# Patient Record
Sex: Male | Born: 1945 | Race: Black or African American | Hispanic: No | Marital: Single
Health system: Southern US, Community
[De-identification: ages and names within clinical notes are randomized; demographics above are authoritative.]

## PROBLEM LIST (undated history)

## (undated) ENCOUNTER — Emergency Department (HOSPITAL_COMMUNITY): Payer: Medicare HMO

## (undated) DIAGNOSIS — I10 Essential (primary) hypertension: Secondary | ICD-10-CM

## (undated) DIAGNOSIS — K635 Polyp of colon: Secondary | ICD-10-CM

## (undated) DIAGNOSIS — E119 Type 2 diabetes mellitus without complications: Secondary | ICD-10-CM

## (undated) DIAGNOSIS — E7800 Pure hypercholesterolemia, unspecified: Secondary | ICD-10-CM

## (undated) HISTORY — PX: COLON SURGERY: SHX602

---

## 2001-12-22 ENCOUNTER — Encounter: Payer: Self-pay | Admitting: Family Medicine

## 2001-12-22 ENCOUNTER — Encounter: Admission: RE | Admit: 2001-12-22 | Discharge: 2001-12-22 | Payer: Self-pay | Admitting: Family Medicine

## 2003-08-31 ENCOUNTER — Ambulatory Visit (HOSPITAL_COMMUNITY): Admission: RE | Admit: 2003-08-31 | Discharge: 2003-08-31 | Payer: Self-pay | Admitting: Urology

## 2003-09-28 ENCOUNTER — Ambulatory Visit: Admission: RE | Admit: 2003-09-28 | Discharge: 2003-12-27 | Payer: Self-pay | Admitting: Radiation Oncology

## 2003-11-24 ENCOUNTER — Encounter: Admission: RE | Admit: 2003-11-24 | Discharge: 2003-11-24 | Payer: Self-pay | Admitting: Urology

## 2003-12-01 ENCOUNTER — Ambulatory Visit (HOSPITAL_BASED_OUTPATIENT_CLINIC_OR_DEPARTMENT_OTHER): Admission: RE | Admit: 2003-12-01 | Discharge: 2003-12-01 | Payer: Self-pay | Admitting: Urology

## 2003-12-01 ENCOUNTER — Ambulatory Visit (HOSPITAL_COMMUNITY): Admission: RE | Admit: 2003-12-01 | Discharge: 2003-12-01 | Payer: Self-pay | Admitting: Urology

## 2003-12-31 ENCOUNTER — Ambulatory Visit: Admission: RE | Admit: 2003-12-31 | Discharge: 2004-01-19 | Payer: Self-pay | Admitting: Radiation Oncology

## 2005-07-21 ENCOUNTER — Inpatient Hospital Stay (HOSPITAL_COMMUNITY): Admission: EM | Admit: 2005-07-21 | Discharge: 2005-07-25 | Payer: Self-pay | Admitting: Emergency Medicine

## 2005-12-02 IMAGING — CR DG CHEST 2V
2 series · 2 of 2 positions shown · non-contrast
Comparison: none

CLINICAL DATA: Preop respiratory exam for prostate carcinoma treatment.
 TWO VIEW CHEST ? 11/24/03:

[view not recorded (1 of 2)]
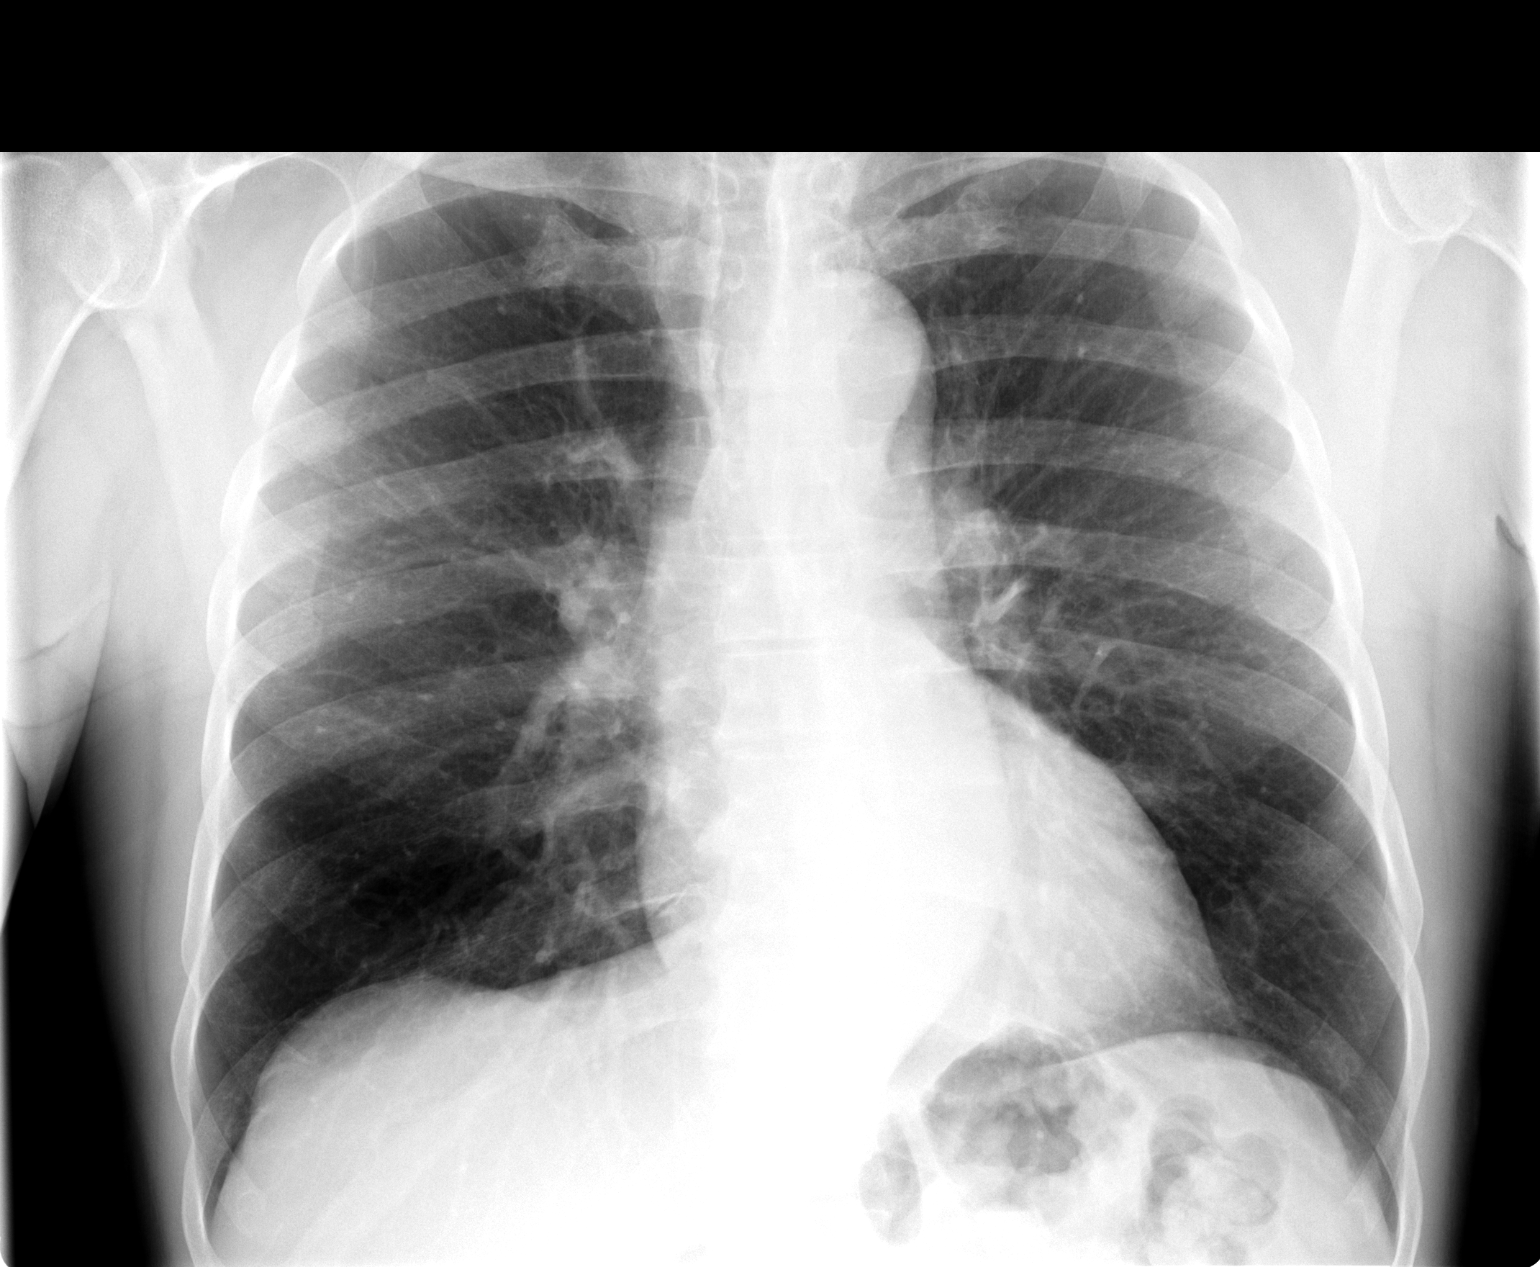

[view not recorded (2 of 2)]
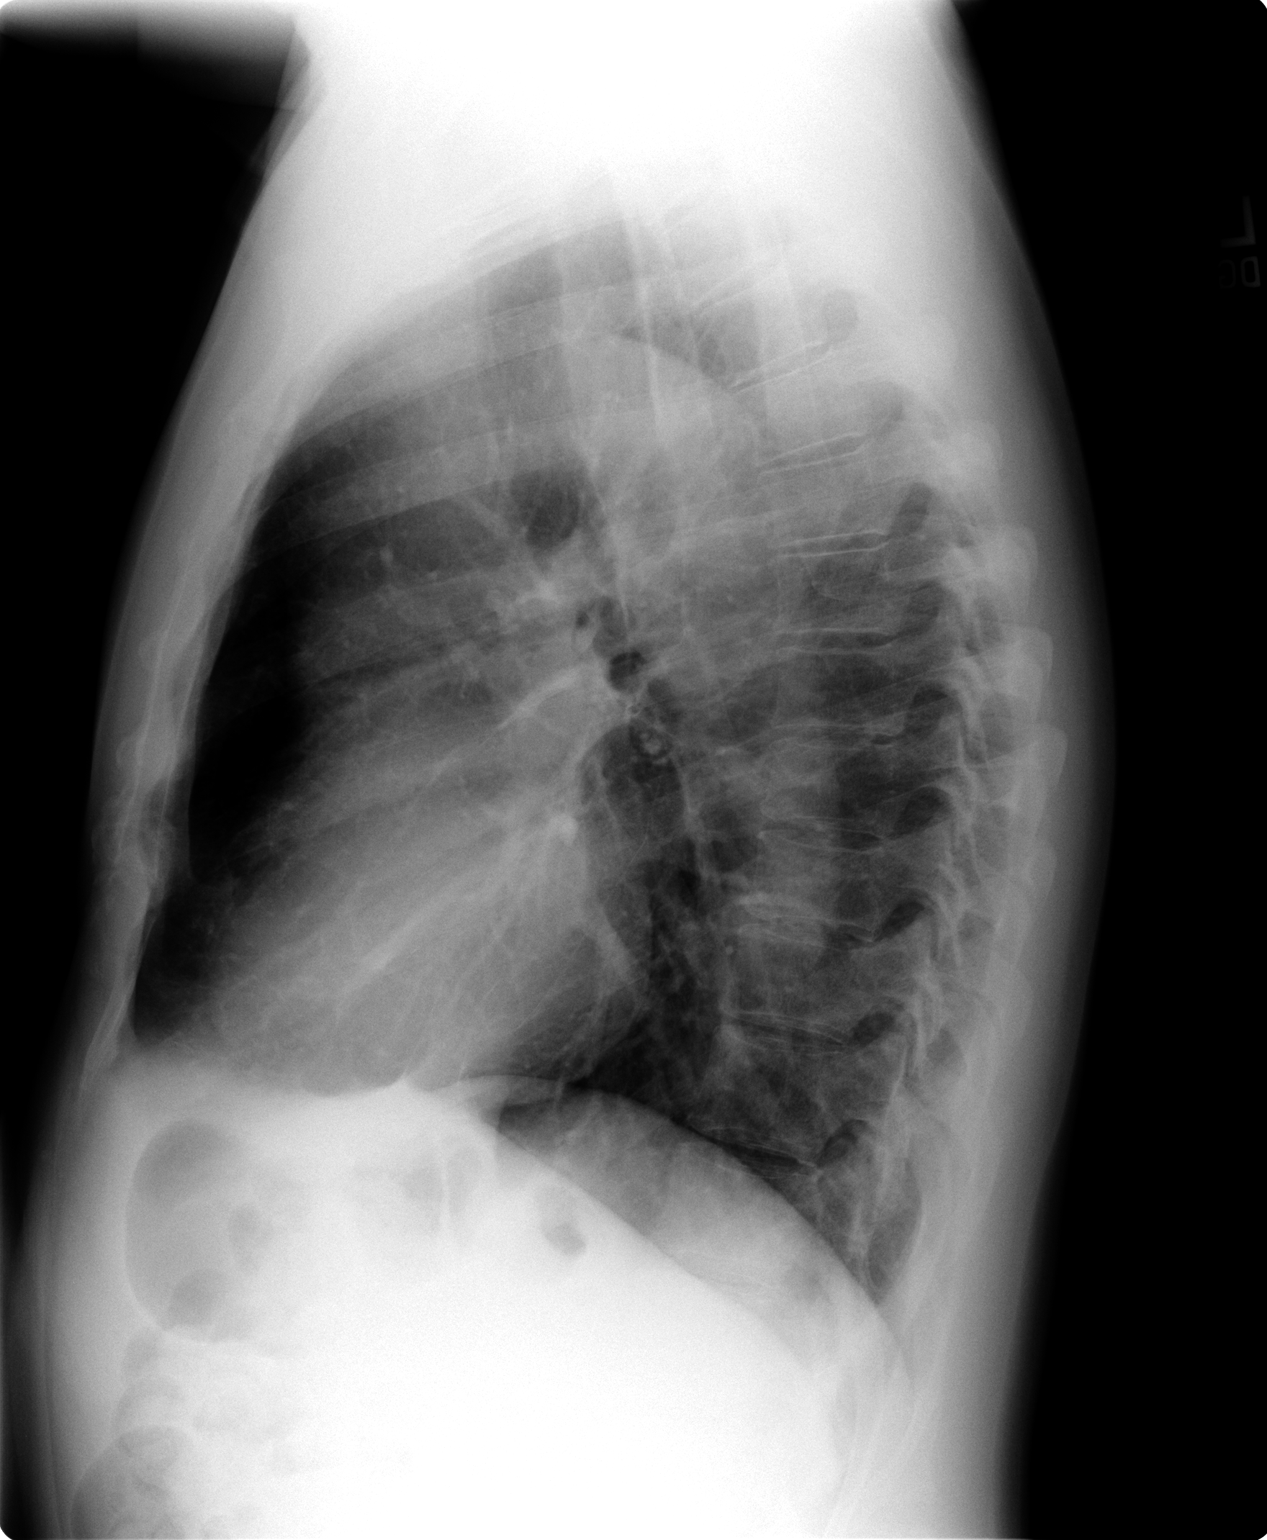

[2 of 2 positions shown; findings below may reference images not displayed]

FINDINGS: Mild cardiomegaly and tortuosity/ectasia of the thoracic aorta noted.  The lungs are clear.  Visualized bony thorax and upper abdomen are unremarkable.
IMPRESSION: 1.  No evidence of acute cardiopulmonary disease.
 2.  Cardiomegaly and ectatic/tortuous thoracic aorta.

## 2007-07-31 IMAGING — CR DG CHEST 1V PORT
1 series · 1 of 1 positions shown · non-contrast
Comparison: 11/24/03.

CLINICAL DATA: 60-year-old with lower GI bleeding. 
 PORTABLE CHEST ? 1 VIEW:

[view not recorded]
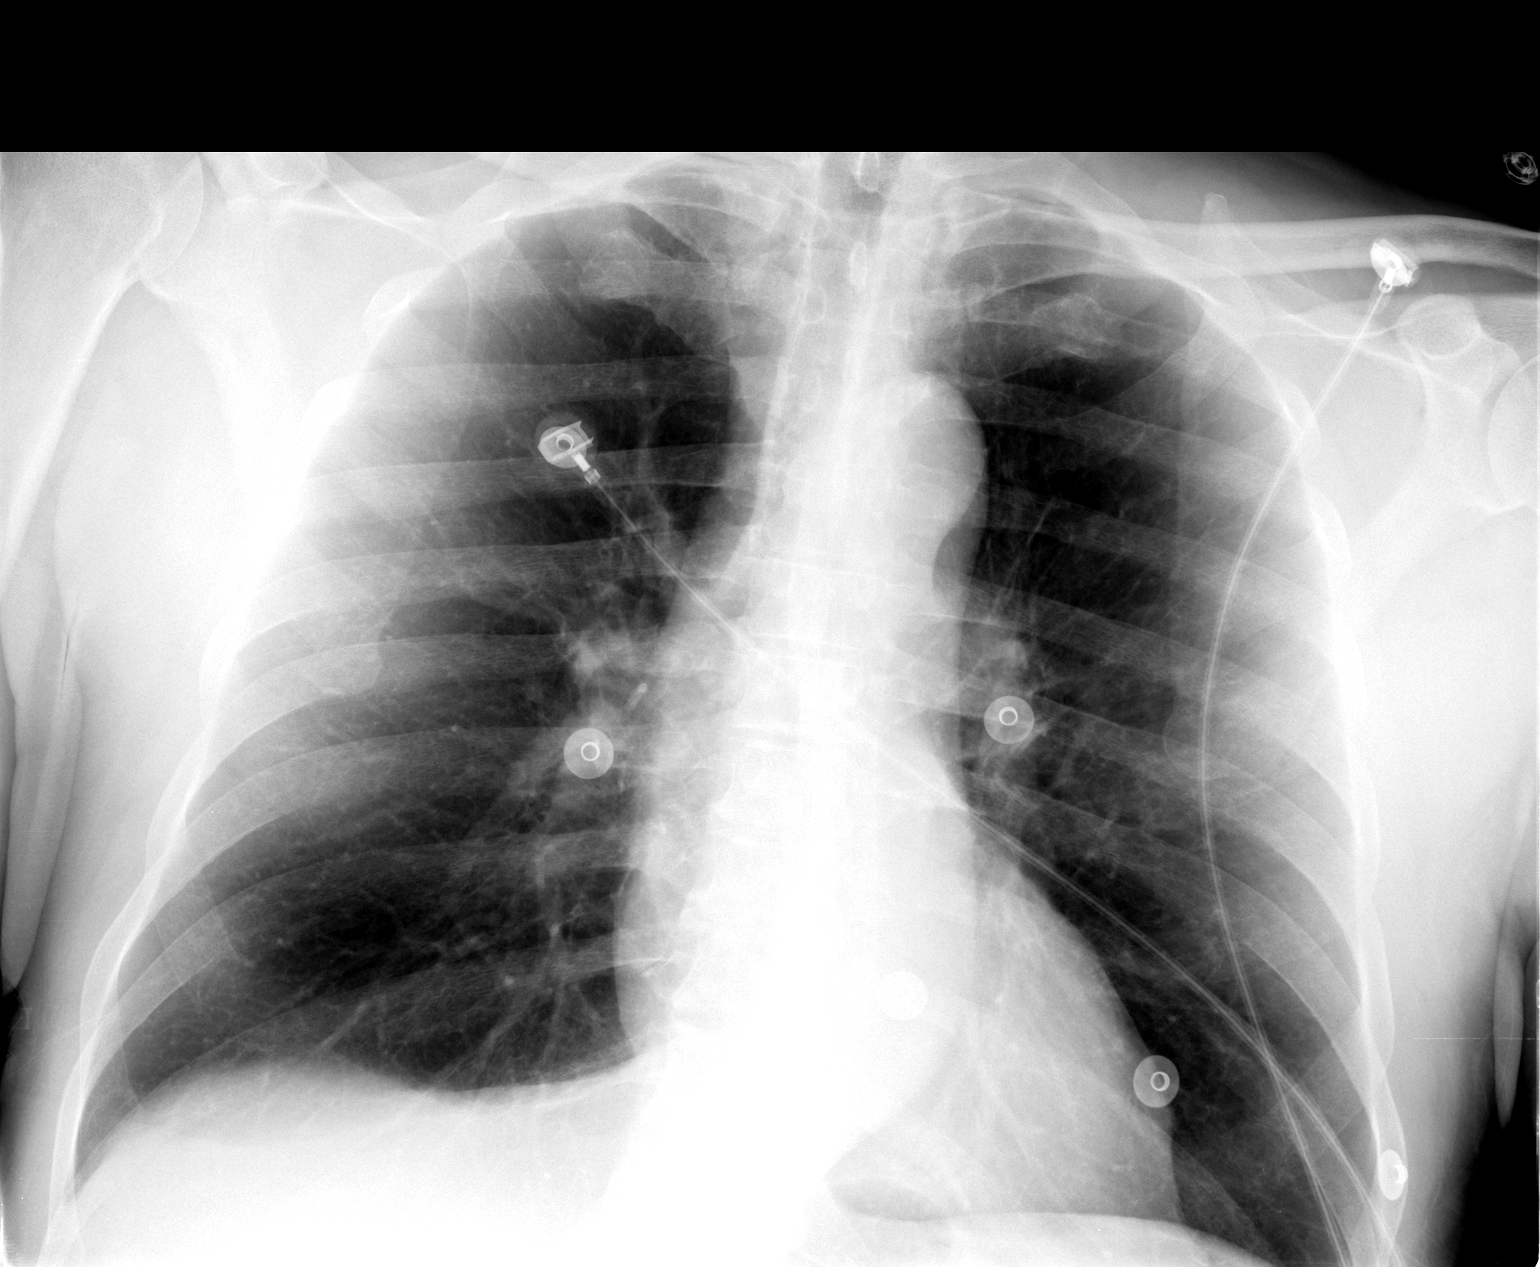

[1 of 1 positions shown; findings below may reference images not displayed]

FINDINGS: Lungs are mildly hyperinflated.  The heart is normal in size.  Lungs are free of focal consolidations and pleural effusions.
IMPRESSION: 1.  Hyperinflation.  
 2.  No evidence for acute cardiopulmonary disease.

## 2011-03-22 DIAGNOSIS — I10 Essential (primary) hypertension: Secondary | ICD-10-CM | POA: Diagnosis not present

## 2011-03-22 DIAGNOSIS — Z79899 Other long term (current) drug therapy: Secondary | ICD-10-CM | POA: Diagnosis not present

## 2011-03-22 DIAGNOSIS — R7309 Other abnormal glucose: Secondary | ICD-10-CM | POA: Diagnosis not present

## 2011-03-22 DIAGNOSIS — E7800 Pure hypercholesterolemia, unspecified: Secondary | ICD-10-CM | POA: Diagnosis not present

## 2011-03-22 DIAGNOSIS — J309 Allergic rhinitis, unspecified: Secondary | ICD-10-CM | POA: Diagnosis not present

## 2011-04-18 DIAGNOSIS — J019 Acute sinusitis, unspecified: Secondary | ICD-10-CM | POA: Diagnosis not present

## 2011-04-18 DIAGNOSIS — J40 Bronchitis, not specified as acute or chronic: Secondary | ICD-10-CM | POA: Diagnosis not present

## 2011-07-27 DIAGNOSIS — H2589 Other age-related cataract: Secondary | ICD-10-CM | POA: Diagnosis not present

## 2011-08-13 DIAGNOSIS — H2589 Other age-related cataract: Secondary | ICD-10-CM | POA: Diagnosis not present

## 2011-08-13 DIAGNOSIS — H2510 Age-related nuclear cataract, unspecified eye: Secondary | ICD-10-CM | POA: Diagnosis not present

## 2011-08-13 DIAGNOSIS — H40009 Preglaucoma, unspecified, unspecified eye: Secondary | ICD-10-CM | POA: Diagnosis not present

## 2011-08-20 DIAGNOSIS — H2510 Age-related nuclear cataract, unspecified eye: Secondary | ICD-10-CM | POA: Diagnosis not present

## 2011-08-20 DIAGNOSIS — H2589 Other age-related cataract: Secondary | ICD-10-CM | POA: Diagnosis not present

## 2011-09-20 DIAGNOSIS — E7800 Pure hypercholesterolemia, unspecified: Secondary | ICD-10-CM | POA: Diagnosis not present

## 2011-09-20 DIAGNOSIS — R7309 Other abnormal glucose: Secondary | ICD-10-CM | POA: Diagnosis not present

## 2011-09-20 DIAGNOSIS — I10 Essential (primary) hypertension: Secondary | ICD-10-CM | POA: Diagnosis not present

## 2011-12-06 DIAGNOSIS — J019 Acute sinusitis, unspecified: Secondary | ICD-10-CM | POA: Diagnosis not present

## 2011-12-06 DIAGNOSIS — H6120 Impacted cerumen, unspecified ear: Secondary | ICD-10-CM | POA: Diagnosis not present

## 2011-12-10 DIAGNOSIS — C61 Malignant neoplasm of prostate: Secondary | ICD-10-CM | POA: Diagnosis not present

## 2011-12-17 DIAGNOSIS — C61 Malignant neoplasm of prostate: Secondary | ICD-10-CM | POA: Diagnosis not present

## 2012-03-28 DIAGNOSIS — R7309 Other abnormal glucose: Secondary | ICD-10-CM | POA: Diagnosis not present

## 2012-03-28 DIAGNOSIS — I10 Essential (primary) hypertension: Secondary | ICD-10-CM | POA: Diagnosis not present

## 2012-03-28 DIAGNOSIS — E7800 Pure hypercholesterolemia, unspecified: Secondary | ICD-10-CM | POA: Diagnosis not present

## 2012-05-26 DIAGNOSIS — H532 Diplopia: Secondary | ICD-10-CM | POA: Diagnosis not present

## 2012-05-27 DIAGNOSIS — Z9181 History of falling: Secondary | ICD-10-CM | POA: Diagnosis not present

## 2012-05-27 DIAGNOSIS — Z6826 Body mass index (BMI) 26.0-26.9, adult: Secondary | ICD-10-CM | POA: Diagnosis not present

## 2012-05-27 DIAGNOSIS — H538 Other visual disturbances: Secondary | ICD-10-CM | POA: Diagnosis not present

## 2012-05-27 DIAGNOSIS — Z1331 Encounter for screening for depression: Secondary | ICD-10-CM | POA: Diagnosis not present

## 2012-05-27 DIAGNOSIS — R7309 Other abnormal glucose: Secondary | ICD-10-CM | POA: Diagnosis not present

## 2012-05-27 DIAGNOSIS — E119 Type 2 diabetes mellitus without complications: Secondary | ICD-10-CM | POA: Diagnosis not present

## 2012-10-03 DIAGNOSIS — E119 Type 2 diabetes mellitus without complications: Secondary | ICD-10-CM | POA: Diagnosis not present

## 2012-10-03 DIAGNOSIS — S91309A Unspecified open wound, unspecified foot, initial encounter: Secondary | ICD-10-CM | POA: Diagnosis not present

## 2012-10-03 DIAGNOSIS — E7800 Pure hypercholesterolemia, unspecified: Secondary | ICD-10-CM | POA: Diagnosis not present

## 2012-10-03 DIAGNOSIS — I10 Essential (primary) hypertension: Secondary | ICD-10-CM | POA: Diagnosis not present

## 2012-10-31 DIAGNOSIS — S058X9A Other injuries of unspecified eye and orbit, initial encounter: Secondary | ICD-10-CM | POA: Diagnosis not present

## 2012-12-18 DIAGNOSIS — N529 Male erectile dysfunction, unspecified: Secondary | ICD-10-CM | POA: Diagnosis not present

## 2012-12-18 DIAGNOSIS — C61 Malignant neoplasm of prostate: Secondary | ICD-10-CM | POA: Diagnosis not present

## 2013-02-02 DIAGNOSIS — E119 Type 2 diabetes mellitus without complications: Secondary | ICD-10-CM | POA: Diagnosis not present

## 2013-02-02 DIAGNOSIS — I10 Essential (primary) hypertension: Secondary | ICD-10-CM | POA: Diagnosis not present

## 2013-02-02 DIAGNOSIS — Z1331 Encounter for screening for depression: Secondary | ICD-10-CM | POA: Diagnosis not present

## 2013-02-02 DIAGNOSIS — E7800 Pure hypercholesterolemia, unspecified: Secondary | ICD-10-CM | POA: Diagnosis not present

## 2013-02-02 DIAGNOSIS — Z9181 History of falling: Secondary | ICD-10-CM | POA: Diagnosis not present

## 2013-02-02 DIAGNOSIS — Z6829 Body mass index (BMI) 29.0-29.9, adult: Secondary | ICD-10-CM | POA: Diagnosis not present

## 2013-06-03 DIAGNOSIS — Z79899 Other long term (current) drug therapy: Secondary | ICD-10-CM | POA: Diagnosis not present

## 2013-06-03 DIAGNOSIS — K219 Gastro-esophageal reflux disease without esophagitis: Secondary | ICD-10-CM | POA: Diagnosis not present

## 2013-06-03 DIAGNOSIS — E7800 Pure hypercholesterolemia, unspecified: Secondary | ICD-10-CM | POA: Diagnosis not present

## 2013-06-03 DIAGNOSIS — I10 Essential (primary) hypertension: Secondary | ICD-10-CM | POA: Diagnosis not present

## 2013-06-03 DIAGNOSIS — E119 Type 2 diabetes mellitus without complications: Secondary | ICD-10-CM | POA: Diagnosis not present

## 2013-07-06 DIAGNOSIS — IMO0002 Reserved for concepts with insufficient information to code with codable children: Secondary | ICD-10-CM | POA: Diagnosis not present

## 2013-07-27 DIAGNOSIS — R141 Gas pain: Secondary | ICD-10-CM | POA: Diagnosis not present

## 2013-07-27 DIAGNOSIS — K6289 Other specified diseases of anus and rectum: Secondary | ICD-10-CM | POA: Diagnosis not present

## 2013-07-27 DIAGNOSIS — Z86010 Personal history of colonic polyps: Secondary | ICD-10-CM | POA: Diagnosis not present

## 2013-07-27 DIAGNOSIS — R142 Eructation: Secondary | ICD-10-CM | POA: Diagnosis not present

## 2013-10-06 DIAGNOSIS — Z23 Encounter for immunization: Secondary | ICD-10-CM | POA: Diagnosis not present

## 2013-10-06 DIAGNOSIS — E119 Type 2 diabetes mellitus without complications: Secondary | ICD-10-CM | POA: Diagnosis not present

## 2013-10-06 DIAGNOSIS — I10 Essential (primary) hypertension: Secondary | ICD-10-CM | POA: Diagnosis not present

## 2013-10-06 DIAGNOSIS — E7800 Pure hypercholesterolemia, unspecified: Secondary | ICD-10-CM | POA: Diagnosis not present

## 2013-11-11 ENCOUNTER — Other Ambulatory Visit: Payer: Self-pay | Admitting: Gastroenterology

## 2013-11-11 DIAGNOSIS — D122 Benign neoplasm of ascending colon: Secondary | ICD-10-CM | POA: Diagnosis not present

## 2013-11-11 DIAGNOSIS — D175 Benign lipomatous neoplasm of intra-abdominal organs: Secondary | ICD-10-CM | POA: Diagnosis not present

## 2013-11-11 DIAGNOSIS — D127 Benign neoplasm of rectosigmoid junction: Secondary | ICD-10-CM | POA: Diagnosis not present

## 2013-11-11 DIAGNOSIS — K621 Rectal polyp: Secondary | ICD-10-CM | POA: Diagnosis not present

## 2013-11-11 DIAGNOSIS — D125 Benign neoplasm of sigmoid colon: Secondary | ICD-10-CM | POA: Diagnosis not present

## 2013-11-11 DIAGNOSIS — Z1211 Encounter for screening for malignant neoplasm of colon: Secondary | ICD-10-CM | POA: Diagnosis not present

## 2013-11-11 DIAGNOSIS — D128 Benign neoplasm of rectum: Secondary | ICD-10-CM | POA: Diagnosis not present

## 2013-11-11 DIAGNOSIS — K648 Other hemorrhoids: Secondary | ICD-10-CM | POA: Diagnosis not present

## 2013-11-11 DIAGNOSIS — K5730 Diverticulosis of large intestine without perforation or abscess without bleeding: Secondary | ICD-10-CM | POA: Diagnosis not present

## 2013-11-11 DIAGNOSIS — Z09 Encounter for follow-up examination after completed treatment for conditions other than malignant neoplasm: Secondary | ICD-10-CM | POA: Diagnosis not present

## 2013-11-11 DIAGNOSIS — D124 Benign neoplasm of descending colon: Secondary | ICD-10-CM | POA: Diagnosis not present

## 2013-11-11 DIAGNOSIS — Z86010 Personal history of colonic polyps: Secondary | ICD-10-CM | POA: Diagnosis not present

## 2013-11-19 DIAGNOSIS — Z23 Encounter for immunization: Secondary | ICD-10-CM | POA: Diagnosis not present

## 2013-12-04 DIAGNOSIS — C61 Malignant neoplasm of prostate: Secondary | ICD-10-CM | POA: Diagnosis not present

## 2013-12-11 DIAGNOSIS — N5201 Erectile dysfunction due to arterial insufficiency: Secondary | ICD-10-CM | POA: Diagnosis not present

## 2013-12-11 DIAGNOSIS — C61 Malignant neoplasm of prostate: Secondary | ICD-10-CM | POA: Diagnosis not present

## 2014-02-11 DIAGNOSIS — E119 Type 2 diabetes mellitus without complications: Secondary | ICD-10-CM | POA: Diagnosis not present

## 2014-02-11 DIAGNOSIS — Z1389 Encounter for screening for other disorder: Secondary | ICD-10-CM | POA: Diagnosis not present

## 2014-02-11 DIAGNOSIS — E780 Pure hypercholesterolemia: Secondary | ICD-10-CM | POA: Diagnosis not present

## 2014-02-11 DIAGNOSIS — I10 Essential (primary) hypertension: Secondary | ICD-10-CM | POA: Diagnosis not present

## 2014-02-11 DIAGNOSIS — Z9181 History of falling: Secondary | ICD-10-CM | POA: Diagnosis not present

## 2014-06-18 DIAGNOSIS — E780 Pure hypercholesterolemia: Secondary | ICD-10-CM | POA: Diagnosis not present

## 2014-06-18 DIAGNOSIS — E119 Type 2 diabetes mellitus without complications: Secondary | ICD-10-CM | POA: Diagnosis not present

## 2014-06-18 DIAGNOSIS — Z6825 Body mass index (BMI) 25.0-25.9, adult: Secondary | ICD-10-CM | POA: Diagnosis not present

## 2014-06-18 DIAGNOSIS — Z1389 Encounter for screening for other disorder: Secondary | ICD-10-CM | POA: Diagnosis not present

## 2014-06-18 DIAGNOSIS — I10 Essential (primary) hypertension: Secondary | ICD-10-CM | POA: Diagnosis not present

## 2014-06-18 DIAGNOSIS — M4726 Other spondylosis with radiculopathy, lumbar region: Secondary | ICD-10-CM | POA: Diagnosis not present

## 2014-06-18 DIAGNOSIS — Z9181 History of falling: Secondary | ICD-10-CM | POA: Diagnosis not present

## 2014-10-28 DIAGNOSIS — E119 Type 2 diabetes mellitus without complications: Secondary | ICD-10-CM | POA: Diagnosis not present

## 2014-10-28 DIAGNOSIS — Z23 Encounter for immunization: Secondary | ICD-10-CM | POA: Diagnosis not present

## 2014-10-28 DIAGNOSIS — Z1389 Encounter for screening for other disorder: Secondary | ICD-10-CM | POA: Diagnosis not present

## 2014-10-28 DIAGNOSIS — Z6824 Body mass index (BMI) 24.0-24.9, adult: Secondary | ICD-10-CM | POA: Diagnosis not present

## 2014-10-28 DIAGNOSIS — I10 Essential (primary) hypertension: Secondary | ICD-10-CM | POA: Diagnosis not present

## 2014-10-28 DIAGNOSIS — E780 Pure hypercholesterolemia: Secondary | ICD-10-CM | POA: Diagnosis not present

## 2014-12-17 DIAGNOSIS — Z8546 Personal history of malignant neoplasm of prostate: Secondary | ICD-10-CM | POA: Diagnosis not present

## 2014-12-17 DIAGNOSIS — N5201 Erectile dysfunction due to arterial insufficiency: Secondary | ICD-10-CM | POA: Diagnosis not present

## 2014-12-27 DIAGNOSIS — J309 Allergic rhinitis, unspecified: Secondary | ICD-10-CM | POA: Diagnosis not present

## 2014-12-27 DIAGNOSIS — Z6824 Body mass index (BMI) 24.0-24.9, adult: Secondary | ICD-10-CM | POA: Diagnosis not present

## 2015-05-10 DIAGNOSIS — E7800 Pure hypercholesterolemia, unspecified: Secondary | ICD-10-CM | POA: Diagnosis not present

## 2015-05-10 DIAGNOSIS — E119 Type 2 diabetes mellitus without complications: Secondary | ICD-10-CM | POA: Diagnosis not present

## 2015-05-10 DIAGNOSIS — Z87891 Personal history of nicotine dependence: Secondary | ICD-10-CM | POA: Diagnosis not present

## 2015-05-10 DIAGNOSIS — Z6825 Body mass index (BMI) 25.0-25.9, adult: Secondary | ICD-10-CM | POA: Diagnosis not present

## 2015-05-10 DIAGNOSIS — I10 Essential (primary) hypertension: Secondary | ICD-10-CM | POA: Diagnosis not present

## 2015-11-23 DIAGNOSIS — E7800 Pure hypercholesterolemia, unspecified: Secondary | ICD-10-CM | POA: Diagnosis not present

## 2015-11-23 DIAGNOSIS — E119 Type 2 diabetes mellitus without complications: Secondary | ICD-10-CM | POA: Diagnosis not present

## 2015-11-23 DIAGNOSIS — I10 Essential (primary) hypertension: Secondary | ICD-10-CM | POA: Diagnosis not present

## 2015-11-23 DIAGNOSIS — D509 Iron deficiency anemia, unspecified: Secondary | ICD-10-CM | POA: Diagnosis not present

## 2015-11-29 DIAGNOSIS — M4726 Other spondylosis with radiculopathy, lumbar region: Secondary | ICD-10-CM | POA: Diagnosis not present

## 2015-11-29 DIAGNOSIS — I10 Essential (primary) hypertension: Secondary | ICD-10-CM | POA: Diagnosis not present

## 2015-11-29 DIAGNOSIS — E663 Overweight: Secondary | ICD-10-CM | POA: Diagnosis not present

## 2015-11-29 DIAGNOSIS — E119 Type 2 diabetes mellitus without complications: Secondary | ICD-10-CM | POA: Diagnosis not present

## 2015-11-29 DIAGNOSIS — Z1389 Encounter for screening for other disorder: Secondary | ICD-10-CM | POA: Diagnosis not present

## 2015-11-29 DIAGNOSIS — Z9181 History of falling: Secondary | ICD-10-CM | POA: Diagnosis not present

## 2015-11-29 DIAGNOSIS — E7800 Pure hypercholesterolemia, unspecified: Secondary | ICD-10-CM | POA: Diagnosis not present

## 2015-12-09 DIAGNOSIS — C61 Malignant neoplasm of prostate: Secondary | ICD-10-CM | POA: Diagnosis not present

## 2015-12-16 DIAGNOSIS — Z8546 Personal history of malignant neoplasm of prostate: Secondary | ICD-10-CM | POA: Diagnosis not present

## 2015-12-16 DIAGNOSIS — N5201 Erectile dysfunction due to arterial insufficiency: Secondary | ICD-10-CM | POA: Diagnosis not present

## 2016-05-28 DIAGNOSIS — D509 Iron deficiency anemia, unspecified: Secondary | ICD-10-CM | POA: Diagnosis not present

## 2016-05-28 DIAGNOSIS — E7800 Pure hypercholesterolemia, unspecified: Secondary | ICD-10-CM | POA: Diagnosis not present

## 2016-05-28 DIAGNOSIS — I10 Essential (primary) hypertension: Secondary | ICD-10-CM | POA: Diagnosis not present

## 2016-05-28 DIAGNOSIS — E119 Type 2 diabetes mellitus without complications: Secondary | ICD-10-CM | POA: Diagnosis not present

## 2016-05-31 DIAGNOSIS — E7800 Pure hypercholesterolemia, unspecified: Secondary | ICD-10-CM | POA: Diagnosis not present

## 2016-05-31 DIAGNOSIS — I10 Essential (primary) hypertension: Secondary | ICD-10-CM | POA: Diagnosis not present

## 2016-05-31 DIAGNOSIS — E119 Type 2 diabetes mellitus without complications: Secondary | ICD-10-CM | POA: Diagnosis not present

## 2016-05-31 DIAGNOSIS — Z6826 Body mass index (BMI) 26.0-26.9, adult: Secondary | ICD-10-CM | POA: Diagnosis not present

## 2016-05-31 DIAGNOSIS — M4726 Other spondylosis with radiculopathy, lumbar region: Secondary | ICD-10-CM | POA: Diagnosis not present

## 2016-07-23 DIAGNOSIS — Z136 Encounter for screening for cardiovascular disorders: Secondary | ICD-10-CM | POA: Diagnosis not present

## 2016-07-23 DIAGNOSIS — Z9181 History of falling: Secondary | ICD-10-CM | POA: Diagnosis not present

## 2016-07-23 DIAGNOSIS — Z1389 Encounter for screening for other disorder: Secondary | ICD-10-CM | POA: Diagnosis not present

## 2016-07-23 DIAGNOSIS — Z125 Encounter for screening for malignant neoplasm of prostate: Secondary | ICD-10-CM | POA: Diagnosis not present

## 2016-07-23 DIAGNOSIS — Z0000 Encounter for general adult medical examination without abnormal findings: Secondary | ICD-10-CM | POA: Diagnosis not present

## 2016-07-23 DIAGNOSIS — E785 Hyperlipidemia, unspecified: Secondary | ICD-10-CM | POA: Diagnosis not present

## 2016-08-10 DIAGNOSIS — R600 Localized edema: Secondary | ICD-10-CM | POA: Diagnosis not present

## 2016-08-10 DIAGNOSIS — E663 Overweight: Secondary | ICD-10-CM | POA: Diagnosis not present

## 2016-08-10 DIAGNOSIS — Z6825 Body mass index (BMI) 25.0-25.9, adult: Secondary | ICD-10-CM | POA: Diagnosis not present

## 2016-08-17 DIAGNOSIS — R600 Localized edema: Secondary | ICD-10-CM | POA: Diagnosis not present

## 2016-08-17 DIAGNOSIS — Z6825 Body mass index (BMI) 25.0-25.9, adult: Secondary | ICD-10-CM | POA: Diagnosis not present

## 2016-08-24 DIAGNOSIS — Z6825 Body mass index (BMI) 25.0-25.9, adult: Secondary | ICD-10-CM | POA: Diagnosis not present

## 2016-08-24 DIAGNOSIS — I10 Essential (primary) hypertension: Secondary | ICD-10-CM | POA: Diagnosis not present

## 2016-08-24 DIAGNOSIS — E119 Type 2 diabetes mellitus without complications: Secondary | ICD-10-CM | POA: Diagnosis not present

## 2016-08-24 DIAGNOSIS — R600 Localized edema: Secondary | ICD-10-CM | POA: Diagnosis not present

## 2016-09-04 DIAGNOSIS — N183 Chronic kidney disease, stage 3 (moderate): Secondary | ICD-10-CM | POA: Diagnosis not present

## 2016-12-18 DIAGNOSIS — C61 Malignant neoplasm of prostate: Secondary | ICD-10-CM | POA: Diagnosis not present

## 2016-12-19 DIAGNOSIS — E7800 Pure hypercholesterolemia, unspecified: Secondary | ICD-10-CM | POA: Diagnosis not present

## 2016-12-19 DIAGNOSIS — N183 Chronic kidney disease, stage 3 (moderate): Secondary | ICD-10-CM | POA: Diagnosis not present

## 2016-12-19 DIAGNOSIS — E119 Type 2 diabetes mellitus without complications: Secondary | ICD-10-CM | POA: Diagnosis not present

## 2016-12-24 DIAGNOSIS — Z23 Encounter for immunization: Secondary | ICD-10-CM | POA: Diagnosis not present

## 2016-12-24 DIAGNOSIS — E119 Type 2 diabetes mellitus without complications: Secondary | ICD-10-CM | POA: Diagnosis not present

## 2016-12-24 DIAGNOSIS — E7800 Pure hypercholesterolemia, unspecified: Secondary | ICD-10-CM | POA: Diagnosis not present

## 2016-12-24 DIAGNOSIS — I10 Essential (primary) hypertension: Secondary | ICD-10-CM | POA: Diagnosis not present

## 2016-12-24 DIAGNOSIS — K219 Gastro-esophageal reflux disease without esophagitis: Secondary | ICD-10-CM | POA: Diagnosis not present

## 2016-12-24 DIAGNOSIS — Z6824 Body mass index (BMI) 24.0-24.9, adult: Secondary | ICD-10-CM | POA: Diagnosis not present

## 2016-12-24 DIAGNOSIS — N183 Chronic kidney disease, stage 3 (moderate): Secondary | ICD-10-CM | POA: Diagnosis not present

## 2016-12-26 DIAGNOSIS — N3281 Overactive bladder: Secondary | ICD-10-CM | POA: Diagnosis not present

## 2016-12-26 DIAGNOSIS — Z8546 Personal history of malignant neoplasm of prostate: Secondary | ICD-10-CM | POA: Diagnosis not present

## 2017-03-04 DIAGNOSIS — R69 Illness, unspecified: Secondary | ICD-10-CM | POA: Diagnosis not present

## 2017-03-11 DIAGNOSIS — R69 Illness, unspecified: Secondary | ICD-10-CM | POA: Diagnosis not present

## 2017-03-12 DIAGNOSIS — R69 Illness, unspecified: Secondary | ICD-10-CM | POA: Diagnosis not present

## 2017-03-19 DIAGNOSIS — R69 Illness, unspecified: Secondary | ICD-10-CM | POA: Diagnosis not present

## 2017-05-06 DIAGNOSIS — Z86010 Personal history of colonic polyps: Secondary | ICD-10-CM | POA: Diagnosis not present

## 2017-06-07 DIAGNOSIS — Z7982 Long term (current) use of aspirin: Secondary | ICD-10-CM | POA: Diagnosis not present

## 2017-06-07 DIAGNOSIS — J309 Allergic rhinitis, unspecified: Secondary | ICD-10-CM | POA: Diagnosis not present

## 2017-06-07 DIAGNOSIS — R32 Unspecified urinary incontinence: Secondary | ICD-10-CM | POA: Diagnosis not present

## 2017-06-07 DIAGNOSIS — N182 Chronic kidney disease, stage 2 (mild): Secondary | ICD-10-CM | POA: Diagnosis not present

## 2017-06-07 DIAGNOSIS — E785 Hyperlipidemia, unspecified: Secondary | ICD-10-CM | POA: Diagnosis not present

## 2017-06-07 DIAGNOSIS — Z809 Family history of malignant neoplasm, unspecified: Secondary | ICD-10-CM | POA: Diagnosis not present

## 2017-06-07 DIAGNOSIS — Z8546 Personal history of malignant neoplasm of prostate: Secondary | ICD-10-CM | POA: Diagnosis not present

## 2017-06-07 DIAGNOSIS — I129 Hypertensive chronic kidney disease with stage 1 through stage 4 chronic kidney disease, or unspecified chronic kidney disease: Secondary | ICD-10-CM | POA: Diagnosis not present

## 2017-06-07 DIAGNOSIS — R69 Illness, unspecified: Secondary | ICD-10-CM | POA: Diagnosis not present

## 2017-06-07 DIAGNOSIS — Z8249 Family history of ischemic heart disease and other diseases of the circulatory system: Secondary | ICD-10-CM | POA: Diagnosis not present

## 2017-06-25 DIAGNOSIS — D126 Benign neoplasm of colon, unspecified: Secondary | ICD-10-CM | POA: Diagnosis not present

## 2017-06-25 DIAGNOSIS — K640 First degree hemorrhoids: Secondary | ICD-10-CM | POA: Diagnosis not present

## 2017-06-25 DIAGNOSIS — Z86010 Personal history of colonic polyps: Secondary | ICD-10-CM | POA: Diagnosis not present

## 2017-06-25 DIAGNOSIS — K5730 Diverticulosis of large intestine without perforation or abscess without bleeding: Secondary | ICD-10-CM | POA: Diagnosis not present

## 2017-06-28 DIAGNOSIS — D126 Benign neoplasm of colon, unspecified: Secondary | ICD-10-CM | POA: Diagnosis not present

## 2017-07-04 DIAGNOSIS — E119 Type 2 diabetes mellitus without complications: Secondary | ICD-10-CM | POA: Diagnosis not present

## 2017-07-04 DIAGNOSIS — N183 Chronic kidney disease, stage 3 (moderate): Secondary | ICD-10-CM | POA: Diagnosis not present

## 2017-07-04 DIAGNOSIS — E7800 Pure hypercholesterolemia, unspecified: Secondary | ICD-10-CM | POA: Diagnosis not present

## 2017-07-10 DIAGNOSIS — E119 Type 2 diabetes mellitus without complications: Secondary | ICD-10-CM | POA: Diagnosis not present

## 2017-07-10 DIAGNOSIS — C61 Malignant neoplasm of prostate: Secondary | ICD-10-CM | POA: Diagnosis not present

## 2017-07-10 DIAGNOSIS — Z1331 Encounter for screening for depression: Secondary | ICD-10-CM | POA: Diagnosis not present

## 2017-07-10 DIAGNOSIS — N183 Chronic kidney disease, stage 3 (moderate): Secondary | ICD-10-CM | POA: Diagnosis not present

## 2017-07-10 DIAGNOSIS — E7800 Pure hypercholesterolemia, unspecified: Secondary | ICD-10-CM | POA: Diagnosis not present

## 2017-07-10 DIAGNOSIS — Z6824 Body mass index (BMI) 24.0-24.9, adult: Secondary | ICD-10-CM | POA: Diagnosis not present

## 2017-07-10 DIAGNOSIS — I10 Essential (primary) hypertension: Secondary | ICD-10-CM | POA: Diagnosis not present

## 2017-09-11 DIAGNOSIS — R69 Illness, unspecified: Secondary | ICD-10-CM | POA: Diagnosis not present

## 2017-09-20 DIAGNOSIS — D120 Benign neoplasm of cecum: Secondary | ICD-10-CM | POA: Diagnosis not present

## 2017-09-20 DIAGNOSIS — K5730 Diverticulosis of large intestine without perforation or abscess without bleeding: Secondary | ICD-10-CM | POA: Diagnosis not present

## 2017-09-20 DIAGNOSIS — Z800 Family history of malignant neoplasm of digestive organs: Secondary | ICD-10-CM | POA: Diagnosis not present

## 2017-09-23 DIAGNOSIS — R69 Illness, unspecified: Secondary | ICD-10-CM | POA: Diagnosis not present

## 2017-10-01 ENCOUNTER — Emergency Department (HOSPITAL_COMMUNITY)
Admission: EM | Admit: 2017-10-01 | Discharge: 2017-10-02 | Disposition: A | Discharge status: Other Acute Inpatient Hospital | Payer: Medicare HMO | Attending: Emergency Medicine | Admitting: Emergency Medicine

## 2017-10-01 ENCOUNTER — Other Ambulatory Visit: Payer: Self-pay

## 2017-10-01 ENCOUNTER — Encounter (HOSPITAL_COMMUNITY): Payer: Self-pay | Admitting: Emergency Medicine

## 2017-10-01 DIAGNOSIS — K625 Hemorrhage of anus and rectum: Secondary | ICD-10-CM

## 2017-10-01 DIAGNOSIS — E119 Type 2 diabetes mellitus without complications: Secondary | ICD-10-CM | POA: Insufficient documentation

## 2017-10-01 DIAGNOSIS — I10 Essential (primary) hypertension: Secondary | ICD-10-CM | POA: Diagnosis not present

## 2017-10-01 HISTORY — DX: Type 2 diabetes mellitus without complications: E11.9

## 2017-10-01 HISTORY — DX: Essential (primary) hypertension: I10

## 2017-10-01 HISTORY — DX: Pure hypercholesterolemia, unspecified: E78.00

## 2017-10-01 HISTORY — DX: Polyp of colon: K63.5

## 2017-10-01 LAB — CBC WITH DIFFERENTIAL/PLATELET
ABS IMMATURE GRANULOCYTES: 0 10*3/uL (ref 0.0–0.1)
Basophils Absolute: 0 10*3/uL (ref 0.0–0.1)
Basophils Relative: 1 %
EOS ABS: 0.1 10*3/uL (ref 0.0–0.7)
Eosinophils Relative: 2 %
HEMATOCRIT: 34.7 % — AB (ref 39.0–52.0)
Hemoglobin: 11.6 g/dL — ABNORMAL LOW (ref 13.0–17.0)
IMMATURE GRANULOCYTES: 1 %
LYMPHS ABS: 1.3 10*3/uL (ref 0.7–4.0)
LYMPHS PCT: 22 %
MCH: 30 pg (ref 26.0–34.0)
MCHC: 33.4 g/dL (ref 30.0–36.0)
MCV: 89.7 fL (ref 78.0–100.0)
Monocytes Absolute: 0.6 10*3/uL (ref 0.1–1.0)
Monocytes Relative: 11 %
NEUTROS ABS: 3.6 10*3/uL (ref 1.7–7.7)
NEUTROS PCT: 63 %
Platelets: 227 10*3/uL (ref 150–400)
RBC: 3.87 MIL/uL — AB (ref 4.22–5.81)
RDW: 12.9 % (ref 11.5–15.5)
WBC: 5.7 10*3/uL (ref 4.0–10.5)

## 2017-10-01 LAB — SAMPLE TO BLOOD BANK

## 2017-10-01 LAB — COMPREHENSIVE METABOLIC PANEL
ALBUMIN: 3.7 g/dL (ref 3.5–5.0)
ALT: 13 U/L (ref 0–44)
AST: 16 U/L (ref 15–41)
Alkaline Phosphatase: 60 U/L (ref 38–126)
Anion gap: 6 (ref 5–15)
BILIRUBIN TOTAL: 0.8 mg/dL (ref 0.3–1.2)
BUN: 19 mg/dL (ref 8–23)
CO2: 25 mmol/L (ref 22–32)
CREATININE: 1.51 mg/dL — AB (ref 0.61–1.24)
Calcium: 9.1 mg/dL (ref 8.9–10.3)
Chloride: 109 mmol/L (ref 98–111)
GFR calc Af Amer: 51 mL/min — ABNORMAL LOW (ref >60)
GFR, EST NON AFRICAN AMERICAN: 44 mL/min — AB (ref >60)
GLUCOSE: 152 mg/dL — AB (ref 70–99)
POTASSIUM: 4.4 mmol/L (ref 3.5–5.1)
Sodium: 140 mmol/L (ref 135–145)
TOTAL PROTEIN: 6.5 g/dL (ref 6.5–8.1)

## 2017-10-01 LAB — PROTIME-INR
INR: 1.03
Prothrombin Time: 13.5 seconds (ref 11.4–15.2)

## 2017-10-01 NOTE — ED Triage Notes (Signed)
Patient reports multiple episodes of rectal bleeding this afternoon , recent colon (polyps) surgery last Friday at [facility] , denies abdominal pain or fever .

## 2017-10-02 DIAGNOSIS — Z8546 Personal history of malignant neoplasm of prostate: Secondary | ICD-10-CM | POA: Diagnosis not present

## 2017-10-02 DIAGNOSIS — Z86010 Personal history of colonic polyps: Secondary | ICD-10-CM | POA: Diagnosis not present

## 2017-10-02 DIAGNOSIS — K625 Hemorrhage of anus and rectum: Secondary | ICD-10-CM | POA: Diagnosis not present

## 2017-10-02 DIAGNOSIS — E785 Hyperlipidemia, unspecified: Secondary | ICD-10-CM | POA: Diagnosis not present

## 2017-10-02 DIAGNOSIS — Z87891 Personal history of nicotine dependence: Secondary | ICD-10-CM | POA: Diagnosis not present

## 2017-10-02 DIAGNOSIS — Z79899 Other long term (current) drug therapy: Secondary | ICD-10-CM | POA: Diagnosis not present

## 2017-10-02 DIAGNOSIS — K922 Gastrointestinal hemorrhage, unspecified: Secondary | ICD-10-CM | POA: Diagnosis not present

## 2017-10-02 DIAGNOSIS — Z8711 Personal history of peptic ulcer disease: Secondary | ICD-10-CM | POA: Diagnosis not present

## 2017-10-02 DIAGNOSIS — D126 Benign neoplasm of colon, unspecified: Secondary | ICD-10-CM | POA: Diagnosis not present

## 2017-10-02 DIAGNOSIS — I10 Essential (primary) hypertension: Secondary | ICD-10-CM | POA: Diagnosis not present

## 2017-10-02 LAB — CBC WITH DIFFERENTIAL/PLATELET
Abs Immature Granulocytes: 0 10*3/uL (ref 0.0–0.1)
BASOS ABS: 0.1 10*3/uL (ref 0.0–0.1)
Basophils Relative: 1 %
EOS PCT: 1 %
Eosinophils Absolute: 0.1 10*3/uL (ref 0.0–0.7)
HCT: 30.9 % — ABNORMAL LOW (ref 39.0–52.0)
Hemoglobin: 10.3 g/dL — ABNORMAL LOW (ref 13.0–17.0)
Immature Granulocytes: 0 %
Lymphocytes Relative: 25 %
Lymphs Abs: 1.3 10*3/uL (ref 0.7–4.0)
MCH: 29.9 pg (ref 26.0–34.0)
MCHC: 33.3 g/dL (ref 30.0–36.0)
MCV: 89.8 fL (ref 78.0–100.0)
MONO ABS: 0.5 10*3/uL (ref 0.1–1.0)
Monocytes Relative: 9 %
NEUTROS ABS: 3.5 10*3/uL (ref 1.7–7.7)
Neutrophils Relative %: 64 %
Platelets: 215 10*3/uL (ref 150–400)
RBC: 3.44 MIL/uL — ABNORMAL LOW (ref 4.22–5.81)
RDW: 12.9 % (ref 11.5–15.5)
WBC: 5.5 10*3/uL (ref 4.0–10.5)

## 2017-10-02 LAB — HEMOGLOBIN AND HEMATOCRIT, BLOOD
HCT: 31.8 % — ABNORMAL LOW (ref 39.0–52.0)
HCT: 32.2 % — ABNORMAL LOW (ref 39.0–52.0)
HEMOGLOBIN: 10.5 g/dL — AB (ref 13.0–17.0)
Hemoglobin: 10.8 g/dL — ABNORMAL LOW (ref 13.0–17.0)

## 2017-10-02 MED ORDER — SODIUM CHLORIDE 0.9 % IV BOLUS
500.0000 mL | Freq: Once | INTRAVENOUS | Status: AC
  Administered 2017-10-02: 500 mL via INTRAVENOUS

## 2017-10-02 NOTE — ED Provider Notes (Signed)
[facility] Provider Note   CSN: [id number] Arrival date & time: 10/01/17  2022     History   Chief Complaint Chief Complaint  Patient presents with  . Rectal Bleeding    HPI Mathew White is a 72 y.o. male.  The history is provided by the patient and medical records.  Rectal Bleeding     72 year old male with history of colonic polyps, diabetes, hyperlipidemia, hypertension, presenting to the ED with rectal bleeding.  States he first started noticing it this evening after he had a bowel movement.  States it was a mix of bright and dark red blood with small amount of clots.  States felt the urge to have a bowel movement again afterwards and it was just blood that time.  He has not had any fever or chills.  Denies any abdominal pain.  Has been eating and drinking well.  Colonoscopy performed by Dr. Arsenio Loader at [facility] on 09/20/17.  Did have polyp removed that was found to be a tubular adenoma, due for repeat colonoscopy in 1 year.  Patient is not currently on any type of anticoagulation.  Patient has not yet notified his GI doctor about his rectal bleeding.  Past Medical History:  Diagnosis Date  . Colon polyps   . Diabetes mellitus without complication ([place])   . Hypercholesterolemia   . Hypertension     There are no active problems to display for this patient.   Past Surgical History:  Procedure Laterality Date  . COLON SURGERY          Home Medications    Prior to Admission medications   Not on File    Family History No family history on file.  Social History Social History   Tobacco Use  . Smoking status: Never Smoker  . Smokeless tobacco: Never Used  Substance Use Topics  . Alcohol use: Not Currently  . Drug use: Never     Allergies   Patient has no known allergies.   Review of Systems Review of Systems  Gastrointestinal: Positive for anal bleeding and hematochezia.  All other systems reviewed and are  negative.    Physical Exam Updated Vital Signs BP 95/79 (BP Location: Right Arm)   Pulse 70   Temp 98 F (36.7 C) (Oral)   Resp 18   Ht 6' (1.829 m)   Wt 81.6 kg   SpO2 100%   BMI 24.41 kg/m   Physical Exam  Constitutional: He is oriented to person, place, and time. He appears well-developed and well-nourished.  HENT:  Head: Normocephalic and atraumatic.  Mouth/Throat: Oropharynx is clear and moist.  Eyes: Pupils are equal, round, and reactive to light. Conjunctivae and EOM are normal.  Neck: Normal range of motion.  Cardiovascular: Normal rate, regular rhythm and normal heart sounds.  Pulmonary/Chest: Effort normal and breath sounds normal.  Abdominal: Soft. Bowel sounds are normal.  Genitourinary:  Genitourinary Comments: Exam chaperoned by RN Normal rectal tone, bright red blood surrounding rectum and on glove during DRE  Musculoskeletal: Normal range of motion.  Neurological: He is alert and oriented to person, place, and time.  Skin: Skin is warm and dry.  Psychiatric: He has a normal mood and affect.  Nursing note and vitals reviewed.    ED Treatments / Results  Labs (all labs ordered are listed, but only abnormal results are displayed) Labs Reviewed  CBC WITH DIFFERENTIAL/PLATELET - Abnormal; Notable for the following components:      Result  Value   RBC 3.87 (*)    Hemoglobin 11.6 (*)    HCT 34.7 (*)    All other components within normal limits  COMPREHENSIVE METABOLIC PANEL - Abnormal; Notable for the following components:   Glucose, Bld 152 (*)    Creatinine, Ser 1.51 (*)    GFR calc non Af Amer 44 (*)    GFR calc Af Amer 51 (*)    All other components within normal limits  PROTIME-INR  SAMPLE TO BLOOD BANK    EKG None  Radiology No results found.  Procedures Procedures (including critical care time)  Medications Ordered in ED Medications - No data to display   Initial Impression / Assessment and Plan / ED Course  I have reviewed the  triage vital signs and the nursing notes.  Pertinent labs & imaging results that were available during my care of the patient were reviewed by me and considered in my medical decision making (see chart for details).  72 year old male here with rectal bleeding.  Had recent colonoscopy on 09/20/2017 with polypectomy, found to represent tubular adenoma.  Reports started having rectal bleeding today.  Denies any abdominal pain, nausea, vomiting, or diarrhea.  He is afebrile and nontoxic.  Abdomen is soft and benign.  Does have bright red blood on rectal exam.  Vital signs are stable, blood pressure has trended down somewhat since arrival.  Patient is asymptomatic without any dizziness, weakness, or feelings of syncope.  Colonoscopy was done by Dr. Arsenio Loader at [facility].  Will discuss with transfer line.  Spoke with GI at [facility], Dr. Doyle Askew-- agrees with transfer.  Spoke with hospitalist, Dr. Marylee Floras-- will accept in transfer.  Unfortunately, no beds available at [facility].  Per hospitalist, re-check CBC in the morning, if significantly worsening we should call back and update them to see what can be done to expedite care.  6:28 AM Bed still not available.  Repeat CBC w/diff ordered for 7am.  Care will be signed out to [facility] [place]. Will follow-up on results and see if any significant worsening.  If so, will need to contact [facility] again.    Final Clinical Impressions(s) / ED Diagnoses   Final diagnoses:  Rectal bleeding    ED Discharge Orders    None       Larene Pickett, PA-C 10/02/17 [id number]    Merryl Hacker, MD 10/02/17 [phone]

## 2017-10-02 NOTE — ED Notes (Signed)
Patient is resting comfortably.no complaints , no episodes of bleeding

## 2017-10-02 NOTE — ED Notes (Signed)
Called [facility] line to get an update on Bed Placement- [facility] is waiting on discharges.

## 2017-10-02 NOTE — ED Notes (Signed)
Patient being transported to [facility] at this time.

## 2017-10-02 NOTE — ED Provider Notes (Signed)
7:33 AM Handoff from [employer] at shift change.   Pt pending transfer to [facility] for monitoring of rectal bleeding after recent colonoscopy. Currently no beds available there.   Repeat CBC shows hgb 11.6 --> 10.3.   Will continue to monitor. Recheck at 11am if still here.   BP 137/86   Pulse 64   Temp 98 F (36.7 C) (Oral)   Resp 16   Ht 6' (1.829 m)   Wt 81.6 kg   SpO2 98%   BMI 24.41 kg/m   1:02 PM Repeat hgb 10.5. Patient continues to await transport. No bed yet. RN [place] has spoke with [facility] to confirm.   4:33 PM Hemoglobin stable. Bed is now available at [facility].   Pt updated. No further bleeding.   BP (!) 156/96 (BP Location: Right Arm)   Pulse 68   Temp (!) 97.5 F (36.4 C) (Oral)   Resp 16   Ht 6' (1.829 m)   Wt 81.6 kg   SpO2 97%   BMI 24.41 kg/m Mathew White, [place] 10/02/17 1634    Dina Rich Barbette Hair, MD 10/05/17 [phone]

## 2017-10-03 DIAGNOSIS — D126 Benign neoplasm of colon, unspecified: Secondary | ICD-10-CM | POA: Diagnosis not present

## 2017-10-03 DIAGNOSIS — I10 Essential (primary) hypertension: Secondary | ICD-10-CM | POA: Diagnosis not present

## 2017-10-03 DIAGNOSIS — Z8546 Personal history of malignant neoplasm of prostate: Secondary | ICD-10-CM | POA: Diagnosis not present

## 2017-10-03 DIAGNOSIS — K9189 Other postprocedural complications and disorders of digestive system: Secondary | ICD-10-CM | POA: Diagnosis not present

## 2017-10-03 DIAGNOSIS — E785 Hyperlipidemia, unspecified: Secondary | ICD-10-CM | POA: Diagnosis not present

## 2017-10-03 DIAGNOSIS — K922 Gastrointestinal hemorrhage, unspecified: Secondary | ICD-10-CM | POA: Diagnosis not present

## 2017-10-03 DIAGNOSIS — Z48815 Encounter for surgical aftercare following surgery on the digestive system: Secondary | ICD-10-CM | POA: Diagnosis not present

## 2017-10-03 DIAGNOSIS — K921 Melena: Secondary | ICD-10-CM | POA: Diagnosis not present

## 2017-10-04 DIAGNOSIS — K921 Melena: Secondary | ICD-10-CM | POA: Diagnosis not present

## 2017-10-04 DIAGNOSIS — D126 Benign neoplasm of colon, unspecified: Secondary | ICD-10-CM | POA: Diagnosis not present

## 2017-10-04 DIAGNOSIS — K922 Gastrointestinal hemorrhage, unspecified: Secondary | ICD-10-CM | POA: Diagnosis not present

## 2017-10-04 DIAGNOSIS — E785 Hyperlipidemia, unspecified: Secondary | ICD-10-CM | POA: Diagnosis not present

## 2017-10-04 DIAGNOSIS — I10 Essential (primary) hypertension: Secondary | ICD-10-CM | POA: Diagnosis not present

## 2017-10-04 DIAGNOSIS — K633 Ulcer of intestine: Secondary | ICD-10-CM | POA: Diagnosis not present

## 2017-10-04 DIAGNOSIS — Z9889 Other specified postprocedural states: Secondary | ICD-10-CM | POA: Diagnosis not present

## 2017-10-04 DIAGNOSIS — Z8546 Personal history of malignant neoplasm of prostate: Secondary | ICD-10-CM | POA: Diagnosis not present

## 2017-10-04 DIAGNOSIS — K5730 Diverticulosis of large intestine without perforation or abscess without bleeding: Secondary | ICD-10-CM | POA: Diagnosis not present

## 2017-10-09 DIAGNOSIS — Z86010 Personal history of colonic polyps: Secondary | ICD-10-CM | POA: Diagnosis not present

## 2017-10-09 DIAGNOSIS — K625 Hemorrhage of anus and rectum: Secondary | ICD-10-CM | POA: Diagnosis not present

## 2017-10-09 DIAGNOSIS — D500 Iron deficiency anemia secondary to blood loss (chronic): Secondary | ICD-10-CM | POA: Diagnosis not present

## 2017-10-09 DIAGNOSIS — Z79899 Other long term (current) drug therapy: Secondary | ICD-10-CM | POA: Diagnosis not present

## 2017-10-09 DIAGNOSIS — I10 Essential (primary) hypertension: Secondary | ICD-10-CM | POA: Diagnosis not present

## 2017-10-10 DIAGNOSIS — Z0000 Encounter for general adult medical examination without abnormal findings: Secondary | ICD-10-CM | POA: Diagnosis not present

## 2017-10-10 DIAGNOSIS — Z1339 Encounter for screening examination for other mental health and behavioral disorders: Secondary | ICD-10-CM | POA: Diagnosis not present

## 2017-10-10 DIAGNOSIS — Z1331 Encounter for screening for depression: Secondary | ICD-10-CM | POA: Diagnosis not present

## 2017-10-10 DIAGNOSIS — E785 Hyperlipidemia, unspecified: Secondary | ICD-10-CM | POA: Diagnosis not present

## 2017-10-10 DIAGNOSIS — Z139 Encounter for screening, unspecified: Secondary | ICD-10-CM | POA: Diagnosis not present

## 2017-10-10 DIAGNOSIS — Z125 Encounter for screening for malignant neoplasm of prostate: Secondary | ICD-10-CM | POA: Diagnosis not present

## 2017-10-10 DIAGNOSIS — Z136 Encounter for screening for cardiovascular disorders: Secondary | ICD-10-CM | POA: Diagnosis not present

## 2017-10-10 DIAGNOSIS — Z9181 History of falling: Secondary | ICD-10-CM | POA: Diagnosis not present

## 2017-10-30 DIAGNOSIS — Z86010 Personal history of colonic polyps: Secondary | ICD-10-CM | POA: Diagnosis not present

## 2017-10-30 DIAGNOSIS — Z800 Family history of malignant neoplasm of digestive organs: Secondary | ICD-10-CM | POA: Diagnosis not present

## 2017-10-31 DIAGNOSIS — N183 Chronic kidney disease, stage 3 (moderate): Secondary | ICD-10-CM | POA: Diagnosis not present

## 2017-10-31 DIAGNOSIS — Z6823 Body mass index (BMI) 23.0-23.9, adult: Secondary | ICD-10-CM | POA: Diagnosis not present

## 2017-10-31 DIAGNOSIS — E119 Type 2 diabetes mellitus without complications: Secondary | ICD-10-CM | POA: Diagnosis not present

## 2017-12-05 DIAGNOSIS — R351 Nocturia: Secondary | ICD-10-CM | POA: Diagnosis not present

## 2017-12-05 DIAGNOSIS — N5201 Erectile dysfunction due to arterial insufficiency: Secondary | ICD-10-CM | POA: Diagnosis not present

## 2017-12-05 DIAGNOSIS — C61 Malignant neoplasm of prostate: Secondary | ICD-10-CM | POA: Diagnosis not present

## 2018-01-08 DIAGNOSIS — N183 Chronic kidney disease, stage 3 (moderate): Secondary | ICD-10-CM | POA: Diagnosis not present

## 2018-01-08 DIAGNOSIS — E119 Type 2 diabetes mellitus without complications: Secondary | ICD-10-CM | POA: Diagnosis not present

## 2018-01-08 DIAGNOSIS — E7800 Pure hypercholesterolemia, unspecified: Secondary | ICD-10-CM | POA: Diagnosis not present

## 2018-01-10 DIAGNOSIS — I10 Essential (primary) hypertension: Secondary | ICD-10-CM | POA: Diagnosis not present

## 2018-01-10 DIAGNOSIS — E119 Type 2 diabetes mellitus without complications: Secondary | ICD-10-CM | POA: Diagnosis not present

## 2018-01-10 DIAGNOSIS — C61 Malignant neoplasm of prostate: Secondary | ICD-10-CM | POA: Diagnosis not present

## 2018-01-10 DIAGNOSIS — N183 Chronic kidney disease, stage 3 (moderate): Secondary | ICD-10-CM | POA: Diagnosis not present

## 2018-01-10 DIAGNOSIS — E7800 Pure hypercholesterolemia, unspecified: Secondary | ICD-10-CM | POA: Diagnosis not present

## 2018-01-10 DIAGNOSIS — Z6824 Body mass index (BMI) 24.0-24.9, adult: Secondary | ICD-10-CM | POA: Diagnosis not present

## 2018-01-10 DIAGNOSIS — Z1331 Encounter for screening for depression: Secondary | ICD-10-CM | POA: Diagnosis not present

## 2018-02-26 DIAGNOSIS — R69 Illness, unspecified: Secondary | ICD-10-CM | POA: Diagnosis not present

## 2018-04-22 DIAGNOSIS — J019 Acute sinusitis, unspecified: Secondary | ICD-10-CM | POA: Diagnosis not present

## 2018-07-16 DIAGNOSIS — E7800 Pure hypercholesterolemia, unspecified: Secondary | ICD-10-CM | POA: Diagnosis not present

## 2018-07-16 DIAGNOSIS — I10 Essential (primary) hypertension: Secondary | ICD-10-CM | POA: Diagnosis not present

## 2018-07-16 DIAGNOSIS — C61 Malignant neoplasm of prostate: Secondary | ICD-10-CM | POA: Diagnosis not present

## 2018-07-16 DIAGNOSIS — N183 Chronic kidney disease, stage 3 (moderate): Secondary | ICD-10-CM | POA: Diagnosis not present

## 2018-07-16 DIAGNOSIS — J309 Allergic rhinitis, unspecified: Secondary | ICD-10-CM | POA: Diagnosis not present

## 2018-07-16 DIAGNOSIS — Z6823 Body mass index (BMI) 23.0-23.9, adult: Secondary | ICD-10-CM | POA: Diagnosis not present

## 2018-07-16 DIAGNOSIS — E119 Type 2 diabetes mellitus without complications: Secondary | ICD-10-CM | POA: Diagnosis not present

## 2018-09-01 DIAGNOSIS — R69 Illness, unspecified: Secondary | ICD-10-CM | POA: Diagnosis not present

## 2018-09-02 DIAGNOSIS — R69 Illness, unspecified: Secondary | ICD-10-CM | POA: Diagnosis not present

## 2018-10-10 DIAGNOSIS — K5730 Diverticulosis of large intestine without perforation or abscess without bleeding: Secondary | ICD-10-CM | POA: Diagnosis not present

## 2018-10-10 DIAGNOSIS — D175 Benign lipomatous neoplasm of intra-abdominal organs: Secondary | ICD-10-CM | POA: Diagnosis not present

## 2018-10-10 DIAGNOSIS — D123 Benign neoplasm of transverse colon: Secondary | ICD-10-CM | POA: Diagnosis not present

## 2018-10-10 DIAGNOSIS — K635 Polyp of colon: Secondary | ICD-10-CM | POA: Diagnosis not present

## 2018-10-10 DIAGNOSIS — Z1211 Encounter for screening for malignant neoplasm of colon: Secondary | ICD-10-CM | POA: Diagnosis not present

## 2018-10-10 DIAGNOSIS — E119 Type 2 diabetes mellitus without complications: Secondary | ICD-10-CM | POA: Diagnosis not present

## 2018-10-10 DIAGNOSIS — Z86010 Personal history of colonic polyps: Secondary | ICD-10-CM | POA: Diagnosis not present

## 2018-10-16 DIAGNOSIS — Z9181 History of falling: Secondary | ICD-10-CM | POA: Diagnosis not present

## 2018-10-16 DIAGNOSIS — Z1331 Encounter for screening for depression: Secondary | ICD-10-CM | POA: Diagnosis not present

## 2018-10-16 DIAGNOSIS — E785 Hyperlipidemia, unspecified: Secondary | ICD-10-CM | POA: Diagnosis not present

## 2018-10-16 DIAGNOSIS — Z125 Encounter for screening for malignant neoplasm of prostate: Secondary | ICD-10-CM | POA: Diagnosis not present

## 2018-10-16 DIAGNOSIS — Z0000 Encounter for general adult medical examination without abnormal findings: Secondary | ICD-10-CM | POA: Diagnosis not present

## 2018-11-18 DIAGNOSIS — R69 Illness, unspecified: Secondary | ICD-10-CM | POA: Diagnosis not present

## 2018-11-22 DIAGNOSIS — R69 Illness, unspecified: Secondary | ICD-10-CM | POA: Diagnosis not present

## 2018-12-04 DIAGNOSIS — C61 Malignant neoplasm of prostate: Secondary | ICD-10-CM | POA: Diagnosis not present

## 2018-12-09 DIAGNOSIS — J302 Other seasonal allergic rhinitis: Secondary | ICD-10-CM | POA: Diagnosis not present

## 2018-12-09 DIAGNOSIS — I10 Essential (primary) hypertension: Secondary | ICD-10-CM | POA: Diagnosis not present

## 2018-12-09 DIAGNOSIS — N400 Enlarged prostate without lower urinary tract symptoms: Secondary | ICD-10-CM | POA: Diagnosis not present

## 2018-12-09 DIAGNOSIS — Z809 Family history of malignant neoplasm, unspecified: Secondary | ICD-10-CM | POA: Diagnosis not present

## 2018-12-09 DIAGNOSIS — Z8249 Family history of ischemic heart disease and other diseases of the circulatory system: Secondary | ICD-10-CM | POA: Diagnosis not present

## 2018-12-09 DIAGNOSIS — Z833 Family history of diabetes mellitus: Secondary | ICD-10-CM | POA: Diagnosis not present

## 2018-12-09 DIAGNOSIS — E785 Hyperlipidemia, unspecified: Secondary | ICD-10-CM | POA: Diagnosis not present

## 2018-12-09 DIAGNOSIS — E119 Type 2 diabetes mellitus without complications: Secondary | ICD-10-CM | POA: Diagnosis not present

## 2018-12-09 DIAGNOSIS — C61 Malignant neoplasm of prostate: Secondary | ICD-10-CM | POA: Diagnosis not present

## 2018-12-11 DIAGNOSIS — R3915 Urgency of urination: Secondary | ICD-10-CM | POA: Diagnosis not present

## 2018-12-11 DIAGNOSIS — R351 Nocturia: Secondary | ICD-10-CM | POA: Diagnosis not present

## 2018-12-11 DIAGNOSIS — R350 Frequency of micturition: Secondary | ICD-10-CM | POA: Diagnosis not present

## 2018-12-11 DIAGNOSIS — N5201 Erectile dysfunction due to arterial insufficiency: Secondary | ICD-10-CM | POA: Diagnosis not present

## 2018-12-11 DIAGNOSIS — C61 Malignant neoplasm of prostate: Secondary | ICD-10-CM | POA: Diagnosis not present

## 2019-01-15 DIAGNOSIS — I10 Essential (primary) hypertension: Secondary | ICD-10-CM | POA: Diagnosis not present

## 2019-01-15 DIAGNOSIS — Z6823 Body mass index (BMI) 23.0-23.9, adult: Secondary | ICD-10-CM | POA: Diagnosis not present

## 2019-01-15 DIAGNOSIS — E119 Type 2 diabetes mellitus without complications: Secondary | ICD-10-CM | POA: Diagnosis not present

## 2019-01-15 DIAGNOSIS — N183 Chronic kidney disease, stage 3 unspecified: Secondary | ICD-10-CM | POA: Diagnosis not present

## 2019-01-15 DIAGNOSIS — Z139 Encounter for screening, unspecified: Secondary | ICD-10-CM | POA: Diagnosis not present

## 2019-01-15 DIAGNOSIS — E7800 Pure hypercholesterolemia, unspecified: Secondary | ICD-10-CM | POA: Diagnosis not present

## 2019-01-15 DIAGNOSIS — C61 Malignant neoplasm of prostate: Secondary | ICD-10-CM | POA: Diagnosis not present

## 2019-07-17 DIAGNOSIS — N183 Chronic kidney disease, stage 3 unspecified: Secondary | ICD-10-CM | POA: Diagnosis not present

## 2019-07-17 DIAGNOSIS — J309 Allergic rhinitis, unspecified: Secondary | ICD-10-CM | POA: Diagnosis not present

## 2019-07-17 DIAGNOSIS — I10 Essential (primary) hypertension: Secondary | ICD-10-CM | POA: Diagnosis not present

## 2019-07-17 DIAGNOSIS — E7800 Pure hypercholesterolemia, unspecified: Secondary | ICD-10-CM | POA: Diagnosis not present

## 2019-07-17 DIAGNOSIS — Z6823 Body mass index (BMI) 23.0-23.9, adult: Secondary | ICD-10-CM | POA: Diagnosis not present

## 2019-07-17 DIAGNOSIS — E119 Type 2 diabetes mellitus without complications: Secondary | ICD-10-CM | POA: Diagnosis not present

## 2019-10-19 DIAGNOSIS — Z9181 History of falling: Secondary | ICD-10-CM | POA: Diagnosis not present

## 2019-10-19 DIAGNOSIS — Z0000 Encounter for general adult medical examination without abnormal findings: Secondary | ICD-10-CM | POA: Diagnosis not present

## 2019-10-19 DIAGNOSIS — E785 Hyperlipidemia, unspecified: Secondary | ICD-10-CM | POA: Diagnosis not present

## 2019-10-19 DIAGNOSIS — Z1331 Encounter for screening for depression: Secondary | ICD-10-CM | POA: Diagnosis not present

## 2019-12-03 DIAGNOSIS — C61 Malignant neoplasm of prostate: Secondary | ICD-10-CM | POA: Diagnosis not present

## 2019-12-09 DIAGNOSIS — R351 Nocturia: Secondary | ICD-10-CM | POA: Diagnosis not present

## 2019-12-09 DIAGNOSIS — C61 Malignant neoplasm of prostate: Secondary | ICD-10-CM | POA: Diagnosis not present

## 2019-12-09 DIAGNOSIS — R350 Frequency of micturition: Secondary | ICD-10-CM | POA: Diagnosis not present

## 2019-12-09 DIAGNOSIS — N5201 Erectile dysfunction due to arterial insufficiency: Secondary | ICD-10-CM | POA: Diagnosis not present

## 2020-02-23 DIAGNOSIS — E119 Type 2 diabetes mellitus without complications: Secondary | ICD-10-CM | POA: Diagnosis not present

## 2020-02-23 DIAGNOSIS — J309 Allergic rhinitis, unspecified: Secondary | ICD-10-CM | POA: Diagnosis not present

## 2020-02-23 DIAGNOSIS — N183 Chronic kidney disease, stage 3 unspecified: Secondary | ICD-10-CM | POA: Diagnosis not present

## 2020-02-23 DIAGNOSIS — Z139 Encounter for screening, unspecified: Secondary | ICD-10-CM | POA: Diagnosis not present

## 2020-02-23 DIAGNOSIS — E7800 Pure hypercholesterolemia, unspecified: Secondary | ICD-10-CM | POA: Diagnosis not present

## 2020-02-23 DIAGNOSIS — Z6823 Body mass index (BMI) 23.0-23.9, adult: Secondary | ICD-10-CM | POA: Diagnosis not present

## 2020-02-23 DIAGNOSIS — I10 Essential (primary) hypertension: Secondary | ICD-10-CM | POA: Diagnosis not present

## 2020-08-24 DIAGNOSIS — Z6823 Body mass index (BMI) 23.0-23.9, adult: Secondary | ICD-10-CM | POA: Diagnosis not present

## 2020-08-24 DIAGNOSIS — N183 Chronic kidney disease, stage 3 unspecified: Secondary | ICD-10-CM | POA: Diagnosis not present

## 2020-08-24 DIAGNOSIS — E119 Type 2 diabetes mellitus without complications: Secondary | ICD-10-CM | POA: Diagnosis not present

## 2020-08-24 DIAGNOSIS — E7800 Pure hypercholesterolemia, unspecified: Secondary | ICD-10-CM | POA: Diagnosis not present

## 2020-08-24 DIAGNOSIS — I10 Essential (primary) hypertension: Secondary | ICD-10-CM | POA: Diagnosis not present

## 2020-08-24 DIAGNOSIS — J309 Allergic rhinitis, unspecified: Secondary | ICD-10-CM | POA: Diagnosis not present

## 2020-10-20 DIAGNOSIS — E785 Hyperlipidemia, unspecified: Secondary | ICD-10-CM | POA: Diagnosis not present

## 2020-10-20 DIAGNOSIS — Z1331 Encounter for screening for depression: Secondary | ICD-10-CM | POA: Diagnosis not present

## 2020-10-20 DIAGNOSIS — Z9181 History of falling: Secondary | ICD-10-CM | POA: Diagnosis not present

## 2020-10-20 DIAGNOSIS — Z0000 Encounter for general adult medical examination without abnormal findings: Secondary | ICD-10-CM | POA: Diagnosis not present

## 2020-11-29 DIAGNOSIS — C61 Malignant neoplasm of prostate: Secondary | ICD-10-CM | POA: Diagnosis not present

## 2020-12-08 DIAGNOSIS — R350 Frequency of micturition: Secondary | ICD-10-CM | POA: Diagnosis not present

## 2020-12-08 DIAGNOSIS — N5201 Erectile dysfunction due to arterial insufficiency: Secondary | ICD-10-CM | POA: Diagnosis not present

## 2020-12-08 DIAGNOSIS — C61 Malignant neoplasm of prostate: Secondary | ICD-10-CM | POA: Diagnosis not present

## 2020-12-08 DIAGNOSIS — R3915 Urgency of urination: Secondary | ICD-10-CM | POA: Diagnosis not present

## 2022-06-08 ENCOUNTER — Ambulatory Visit (INDEPENDENT_AMBULATORY_CARE_PROVIDER_SITE_OTHER): Payer: Medicare HMO

## 2022-06-08 ENCOUNTER — Ambulatory Visit: Payer: Medicare HMO | Admitting: Podiatry

## 2022-06-08 DIAGNOSIS — S92324A Nondisplaced fracture of second metatarsal bone, right foot, initial encounter for closed fracture: Secondary | ICD-10-CM

## 2022-06-08 DIAGNOSIS — S99921A Unspecified injury of right foot, initial encounter: Secondary | ICD-10-CM | POA: Diagnosis not present

## 2022-06-08 MED ORDER — ACETAMINOPHEN-CODEINE 300-30 MG PO TABS: 1.0000 | ORAL_TABLET | ORAL | 0 refills | Status: DC | PRN
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
## 2022-07-06 ENCOUNTER — Ambulatory Visit (INDEPENDENT_AMBULATORY_CARE_PROVIDER_SITE_OTHER): Payer: Medicare HMO

## 2022-07-06 ENCOUNTER — Ambulatory Visit: Payer: Medicare HMO | Admitting: Podiatry

## 2022-07-06 DIAGNOSIS — S92324A Nondisplaced fracture of second metatarsal bone, right foot, initial encounter for closed fracture: Secondary | ICD-10-CM

## 2022-07-06 NOTE — Progress Notes (Signed)
  Subjective:  Patient ID: Mathew White, male    DOB: 07/15/1945,  MRN: [id number]  Chief Complaint  Patient presents with   Follow-up    Pt states he is following up for a fracture is not taking any pain medication and has been wearing his boot. Has been keeping his foot elevated and no new  bruising noted    77 y.o. male presents with the above complaint.  Patient presents for follow-up of right second metatarsal fracture.  He states is doing better cam boot helped denies any other acute complaints   Review of Systems: Negative except as noted in the HPI. Denies N/V/F/Ch.  Past Medical History:  Diagnosis Date   Colon polyps    Diabetes mellitus without complication (HCC)    Hypercholesterolemia    Hypertension     Current Outpatient Medications:    acetaminophen-codeine (TYLENOL #3) 300-30 MG tablet, Take 1-2 tablets by mouth every 4 (four) hours as needed for moderate pain., Disp: 30 tablet, Rfl: 0   amLODipine-benazepril (LOTREL) 10-40 MG capsule, Take 1 capsule by mouth daily., Disp: , Rfl: 3   atorvastatin (LIPITOR) 20 MG tablet, Take 20 mg by mouth at bedtime., Disp: , Rfl: 3   loratadine (CLARITIN) 10 MG tablet, Take 10 mg by mouth daily., Disp: , Rfl: 11   tamsulosin (FLOMAX) 0.4 MG CAPS capsule, Take 0.4 mg by mouth daily., Disp: , Rfl: 2  Social History   Tobacco Use  Smoking Status Not on file  Smokeless Tobacco Never    No Known Allergies Objective:  There were no vitals filed for this visit. There is no height or weight on file to calculate BMI. Constitutional Well developed. Well nourished.  Vascular Dorsalis pedis pulses palpable bilaterally. Posterior tibial pulses palpable bilaterally. Capillary refill normal to all digits.  No cyanosis or clubbing noted. Pedal hair growth normal.  Neurologic Normal speech. Oriented to person, place, and time. Epicritic sensation to light touch grossly present bilaterally.  Dermatologic Nails well groomed and normal  in appearance. No open wounds. No skin lesions.  Orthopedic: Very mild pain on palpation of right second metatarsal without ecchymosis and bruising noted.  No signs of hematoma noted.  No infection noted no open wounds noted.   Radiographs: 3 views of skeletally mature the right foot: Second metatarsal fracture noted slightly shortened no deviation.  Still in good apposition.  Minimally displaced.  Callus formation noted Assessment:   1. Closed nondisplaced fracture of second metatarsal bone of right foot, initial encounter    Plan:  Patient was evaluated and treated and all questions answered.  Right second metatarsal fracture -All questions and concerns were discussed with the patient in extensive detail  -Pain is improved considerably.  At this time I discussed with the patient given that his pain has resolved but still some occasional pain patient can transition to regular shoes.  Bone callus formation has is formed around the fracture site.  No follow-ups on file.

## 2023-06-30 DEATH — deceased
